# Patient Record
Sex: Female | Born: 1960 | Race: White | Hispanic: No | Marital: Single | Smoking: Never smoker
Health system: Southern US, Community
[De-identification: ages and names within clinical notes are randomized; demographics above are authoritative.]

## PROBLEM LIST (undated history)

## (undated) DIAGNOSIS — R002 Palpitations: Secondary | ICD-10-CM

## (undated) DIAGNOSIS — R030 Elevated blood-pressure reading, without diagnosis of hypertension: Secondary | ICD-10-CM

## (undated) DIAGNOSIS — R9439 Abnormal result of other cardiovascular function study: Secondary | ICD-10-CM

## (undated) DIAGNOSIS — Z8249 Family history of ischemic heart disease and other diseases of the circulatory system: Secondary | ICD-10-CM

## (undated) HISTORY — DX: Palpitations: R00.2

## (undated) HISTORY — PX: BREAST LUMPECTOMY: SHX2

## (undated) HISTORY — DX: Abnormal result of other cardiovascular function study: R94.39

## (undated) HISTORY — DX: Elevated blood-pressure reading, without diagnosis of hypertension: R03.0

## (undated) HISTORY — DX: Family history of ischemic heart disease and other diseases of the circulatory system: Z82.49

## (undated) HISTORY — PX: REDUCTION MAMMAPLASTY: SUR839

---

## 1999-07-09 ENCOUNTER — Other Ambulatory Visit: Admission: RE | Admit: 1999-07-09 | Discharge: 1999-07-09 | Payer: Self-pay | Admitting: Gynecology

## 1999-10-14 ENCOUNTER — Encounter: Admission: RE | Admit: 1999-10-14 | Discharge: 2000-01-12 | Payer: Self-pay | Admitting: Gynecology

## 2000-11-14 ENCOUNTER — Encounter: Payer: Self-pay | Admitting: Family Medicine

## 2000-11-14 ENCOUNTER — Encounter: Admission: RE | Admit: 2000-11-14 | Discharge: 2000-11-14 | Payer: Self-pay | Admitting: Family Medicine

## 2003-12-20 ENCOUNTER — Encounter: Admission: RE | Admit: 2003-12-20 | Discharge: 2003-12-20 | Payer: Self-pay | Admitting: Family Medicine

## 2004-12-23 ENCOUNTER — Encounter: Admission: RE | Admit: 2004-12-23 | Discharge: 2004-12-23 | Payer: Self-pay | Admitting: Family Medicine

## 2005-03-16 ENCOUNTER — Encounter: Admission: RE | Admit: 2005-03-16 | Discharge: 2005-03-16 | Payer: Self-pay | Admitting: Family Medicine

## 2006-04-08 ENCOUNTER — Encounter: Admission: RE | Admit: 2006-04-08 | Discharge: 2006-04-08 | Payer: Self-pay | Admitting: Family Medicine

## 2006-05-02 ENCOUNTER — Encounter: Admission: RE | Admit: 2006-05-02 | Discharge: 2006-05-02 | Payer: Self-pay | Admitting: Family Medicine

## 2010-04-26 ENCOUNTER — Encounter: Payer: Self-pay | Admitting: Family Medicine

## 2010-10-22 ENCOUNTER — Other Ambulatory Visit: Payer: Self-pay | Admitting: Family Medicine

## 2010-10-22 ENCOUNTER — Ambulatory Visit
Admission: RE | Admit: 2010-10-22 | Discharge: 2010-10-22 | Disposition: A | Discharge status: Home / Self Care | Payer: BC Managed Care – PPO | Source: Ambulatory Visit | Attending: Family Medicine | Admitting: Family Medicine

## 2010-10-22 DIAGNOSIS — M542 Cervicalgia: Secondary | ICD-10-CM

## 2010-10-27 ENCOUNTER — Other Ambulatory Visit: Payer: Self-pay | Admitting: Family Medicine

## 2010-10-27 ENCOUNTER — Ambulatory Visit
Admission: RE | Admit: 2010-10-27 | Discharge: 2010-10-27 | Disposition: A | Discharge status: Home / Self Care | Payer: BC Managed Care – PPO | Source: Ambulatory Visit | Attending: Family Medicine | Admitting: Family Medicine

## 2010-10-27 DIAGNOSIS — M542 Cervicalgia: Secondary | ICD-10-CM

## 2012-06-03 DIAGNOSIS — R002 Palpitations: Secondary | ICD-10-CM | POA: Insufficient documentation

## 2012-06-03 DIAGNOSIS — R9439 Abnormal result of other cardiovascular function study: Secondary | ICD-10-CM

## 2012-06-03 HISTORY — DX: Abnormal result of other cardiovascular function study: R94.39

## 2012-06-03 HISTORY — DX: Palpitations: R00.2

## 2012-07-10 HISTORY — PX: OTHER SURGICAL HISTORY: SHX169

## 2012-07-26 ENCOUNTER — Other Ambulatory Visit (HOSPITAL_COMMUNITY): Payer: Self-pay | Admitting: Cardiology

## 2012-07-26 DIAGNOSIS — R9439 Abnormal result of other cardiovascular function study: Secondary | ICD-10-CM

## 2012-08-04 ENCOUNTER — Ambulatory Visit (HOSPITAL_COMMUNITY)
Admission: RE | Admit: 2012-08-04 | Discharge: 2012-08-04 | Disposition: A | Discharge status: Home / Self Care | Payer: BC Managed Care – PPO | Source: Ambulatory Visit | Attending: Cardiovascular Disease | Admitting: Cardiovascular Disease

## 2012-08-04 DIAGNOSIS — R9439 Abnormal result of other cardiovascular function study: Secondary | ICD-10-CM | POA: Insufficient documentation

## 2012-08-04 HISTORY — PX: TRANSTHORACIC ECHOCARDIOGRAM: SHX275

## 2012-08-04 NOTE — Progress Notes (Signed)
 [facility]   2D echo completed 08/04/2012.   Veda Canning, RDCS

## 2012-08-16 ENCOUNTER — Telehealth: Payer: Self-pay | Admitting: Cardiology

## 2012-08-16 NOTE — Telephone Encounter (Signed)
called pt to r/s from no show on 4/22

## 2012-08-18 ENCOUNTER — Telehealth: Payer: Self-pay | Admitting: Cardiology

## 2012-08-18 NOTE — Telephone Encounter (Signed)
Wants echo results from 08-04-12 please!

## 2012-08-18 NOTE — Telephone Encounter (Signed)
Message forwarded to S. Martin, RN.  

## 2012-08-22 ENCOUNTER — Telehealth: Payer: Self-pay | Admitting: *Deleted

## 2012-08-22 NOTE — Telephone Encounter (Signed)
Spoke to patient  The results of Echo was given. Her follow up is in 7 months after CPET test. She should be on metoprolol 25 mg. She states that she is still having the same issue. She wants to know what is next.

## 2012-08-22 NOTE — Telephone Encounter (Signed)
I cannot answer that question without her chart. Please pull her paper chart.  Marykay Lex, MD

## 2012-08-23 NOTE — Telephone Encounter (Signed)
If she is still having Chest pains - with the moderately abnormal CPET & normal Echo -- we should proceed with TM Cardiolite Marykay Lex, MD

## 2012-08-30 NOTE — Telephone Encounter (Signed)
Call patient had to leave a message to call back

## 2012-08-31 ENCOUNTER — Telehealth: Payer: Self-pay | Admitting: *Deleted

## 2012-08-31 NOTE — Telephone Encounter (Signed)
That sounds reasonable.  When am I seeing her back? None of her notes have been uploaded to epic.

## 2012-08-31 NOTE — Telephone Encounter (Signed)
Spoke with patient. Per Dr Herbie Baltimore if symptoms are still persistent the next step is a stress myoview. Jessica Salinas states that the symptoms seem to getting better. She just recently increase her metoprolol up to 25 mg daily. She would like to monitor it a little longer. Inform her to do that and call if symptoms get worse. She is aware another MET test is plan for Oct 2014.

## 2012-12-20 ENCOUNTER — Other Ambulatory Visit: Payer: Self-pay | Admitting: *Deleted

## 2012-12-20 DIAGNOSIS — R0602 Shortness of breath: Secondary | ICD-10-CM

## 2013-02-13 ENCOUNTER — Telehealth: Payer: Self-pay | Admitting: Cardiology

## 2013-02-13 NOTE — Telephone Encounter (Signed)
Patient has been called several times to schedule Met test--CPET only that was ordered by Dr. Herbie Baltimore for October, 2014.  Calls were made 01/01/13--01/04/13--01/10/13.  Patient states that she cannot afford to have this test done.

## 2013-02-15 NOTE — Telephone Encounter (Signed)
We will just have to continue expectant management.   I am fine with cancelling CPET-MET.  Marykay Lex, MD

## 2013-02-15 NOTE — Telephone Encounter (Signed)
Will defer Dr Herbie Baltimore

## 2013-02-16 NOTE — Telephone Encounter (Signed)
Forward to [employer] on 02/15/13

## 2013-03-22 ENCOUNTER — Encounter: Payer: Self-pay | Admitting: Cardiology

## 2013-03-22 ENCOUNTER — Ambulatory Visit (INDEPENDENT_AMBULATORY_CARE_PROVIDER_SITE_OTHER): Payer: BC Managed Care – PPO | Admitting: Cardiology

## 2013-03-22 VITALS — BP 126/88 | HR 73 | Ht 67.0 in | Wt 148.9 lb

## 2013-03-22 DIAGNOSIS — R002 Palpitations: Secondary | ICD-10-CM

## 2013-03-22 DIAGNOSIS — Z79899 Other long term (current) drug therapy: Secondary | ICD-10-CM

## 2013-03-22 DIAGNOSIS — R9439 Abnormal result of other cardiovascular function study: Secondary | ICD-10-CM

## 2013-03-22 DIAGNOSIS — E782 Mixed hyperlipidemia: Secondary | ICD-10-CM

## 2013-03-22 DIAGNOSIS — Z8249 Family history of ischemic heart disease and other diseases of the circulatory system: Secondary | ICD-10-CM

## 2013-03-22 NOTE — Progress Notes (Signed)
PATIENT: Jessica Salinas MRN: [id number]  DOB: 10-Apr-1960   DOV:03/24/2013 PCP: Warrick Parisian, MD  Clinic Note: Chief Complaint  Patient presents with  . ROV 7 months    C/o occas mild shortness of breath-with exertion, rt leg pain-behind knee and calf, and lightheadedness.    HPI: Jessica Salinas is a 52 y.o.  female with a PMH below who presents today for delayed six-month followup for palpitations and abnormal CPET-MAC test with a Peak VO2 of 73%.  Interval History: I last saw her in April the recommendation at that time were increasing beta blocker dose for palpitations and heart rate response in CPET test. She is also to adjust her diet along with exercise for lipid control. She has had an echocardiogram that is relatively normal. She is supposed to do a repeat CPET, but did not schedule it due to timing and financial restraints.  She comes in today really feeling okay. She still does get exertional dyspnea if she really his exercise level. Only mildly short of breath, and less often. She has been trying to pick up her activity level,  But is in need pain of late. She does occasional dizziness and lightheadedness but is almost a vertigo type sensation of the world feeling unstable.  Most palpitations she notes occurring now or during hot flashes. She denies any exertional chest discomfort, and less palpitations and 4. No resting chest discomfort or dyspnea. No PND, orthopnea or edema. No significant near CAD, TIA or amaurosis fugax symptoms. No melena, hematochezia, hematuria or nosebleeds. No claudication.  Past Medical History  Diagnosis Date  . Family history of premature coronary artery disease     Father and brother with extensive CAD in early 21s. Also paternal grandfather died of MI at about 35.  . Borderline hypertension   . Palpitations March 2014    Event monitor with one episode of sinus tachycardia, rare PVCs. No review  . Abnormal cardiovascular stress test March 2014   Borderline maximal effort. 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 Transthoracic echocardiogram  08/04/2012    Normal LV size and function. Normal EF 55-60%. No wall motion abnormalities. Normal diastolic pressure.    Allergies  Allergen Reactions  . Sulfa Antibiotics Hives    Current Outpatient Prescriptions  Medication Sig Dispense Refill  . aspirin EC 81 MG tablet Take 81 mg by mouth daily.      Marland Kitchen escitalopram (LEXAPRO) 10 MG tablet Take 10 mg by mouth daily.      . metoprolol succinate (TOPROL-XL) 25 MG 24 hr tablet Take 25 mg by mouth daily.       No current facility-administered medications for this visit.    History   Social History Narrative   She is a married, mother of 2. Does not smoke and does not drink.   ROS: A comprehensive Review of Systems - Negative except pertinent symptoms noted above including knee and calf pain on the right. Otherwise negative. She is starting to have hot flashes.  PHYSICAL EXAM BP 126/88  Pulse 73  Ht 5\' 7"  (1.702 m)  Wt 148 lb 14.4 oz (67.541 kg)  BMI 23.32 kg/m2 General appearance: Alert and oriented x3. Pleasant mood and affect. Healthy-appearing, appears stated age. Well-nourished well-groomed. HEENT: [place]/AT, EOMI, MMM, anicteric sclera Neck: no adenopathy, no carotid bruit, no JVD, supple, symmetrical, trachea  midline and thyroid not enlarged, symmetric, no tenderness/mass/nodules Lungs: clear to auscultation bilaterally, normal percussion bilaterally and Nonlabored, good air movement Heart: regular rate and rhythm, S1, S2 normal, no murmur, click, rub or gallop and normal apical impulse Abdomen: soft, non-tender; bowel sounds normal; no masses,  no organomegaly Extremities: extremities normal, atraumatic, no cyanosis or edema Pulses: 2+ and  symmetric Neurologic: Grossly normal  WUJ:[id number] today: Yes Rate: 73 , Rhythm: NSR, normal ECG;   Recent Labs: No recent labs  ASSESSMENT / PLAN: Abnormal cardiovascular stress test The plan was for her to have repeat study to investigate how well she is done with diet and exercise modification. I did spend several minutes explained to her the importance of this result. 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 With this result of  Peak VO2 of 73%,she had reversible phase of CAD.  Plan: She is still reluctant to use medications besides metoprolol. Continue metoprolol 25 mg daily. Also recommended potentially using coenzyme Q10. Otherwise the recommendation is to continue to increase her exercise level and loss titrate intake to reduce lipid profile.  Recheck lipid profile in the summertime. Then followup after  If available at that time, will order repeat cardiovascular stress test to determine if her status has improved.  Family history of premature coronary artery disease If her lipid panel was not improved at next followup, we would need to consider treatment with a statin. We'll continue beta blocker and aspirin for prevention.  Mixed hyperlipidemia Followup lipids in the summertime. Would need to use statin if not improved with dietary modification plus exercise.  Palpitations Improved on beta blocker. Only noted now with hot flashes.    No orders of the defined types were placed in this encounter.   Meds ordered this encounter  Medications  . escitalopram (LEXAPRO) 10 MG tablet    Sig: Take 10 mg by mouth daily.  . metoprolol succinate (TOPROL-XL) 25 MG 24 hr tablet    Sig: Take 25 mg by mouth daily.  Marland Kitchen aspirin EC 81 MG tablet    Sig: Take 81 mg by mouth daily.    Followup: Roughly 9-10 months  DAVID W. Herbie Baltimore, M.D., M.S. [facility] [address] [place], [place]  [address]  [phone] Pager # [phone]

## 2013-03-22 NOTE — Patient Instructions (Addendum)
Please do labs -cmp ,lipid -fasting(nothing to eat or drink the morning of labs) Aug 2015. Will mail you the lab in July 2015   Your physician wants you to follow-up in Sept 2015  You will receive a reminder letter in the mail two months in advance. If you don't receive a letter, please call our office to schedule the follow-up appointment.

## 2013-03-24 ENCOUNTER — Encounter: Payer: Self-pay | Admitting: Cardiology

## 2013-03-24 DIAGNOSIS — Z8249 Family history of ischemic heart disease and other diseases of the circulatory system: Secondary | ICD-10-CM | POA: Insufficient documentation

## 2013-03-24 DIAGNOSIS — E782 Mixed hyperlipidemia: Secondary | ICD-10-CM | POA: Insufficient documentation

## 2013-03-24 NOTE — Assessment & Plan Note (Signed)
Followup lipids in the summertime. Would need to use statin if not improved with dietary modification plus exercise.

## 2013-03-24 NOTE — Assessment & Plan Note (Signed)
If her lipid panel was not improved at next followup, we would need to consider treatment with a statin. We'll continue beta blocker and aspirin for prevention.

## 2013-03-24 NOTE — Assessment & Plan Note (Signed)
The plan was for her to have repeat study to investigate how well she is done with diet and exercise modification. I did spend several minutes explained to her the importance of this result. 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 With this result of  Peak VO2 of 73%,she had reversible phase of CAD.  Plan: She is still reluctant to use medications besides metoprolol. Continue metoprolol 25 mg daily. Also recommended potentially using coenzyme Q10. Otherwise the recommendation is to continue to increase her exercise level and loss titrate intake to reduce lipid profile.  Recheck lipid profile in the summertime. Then followup after  If available at that time, will order repeat cardiovascular stress test to determine if her status has improved.

## 2013-03-24 NOTE — Assessment & Plan Note (Signed)
Improved on beta blocker. Only noted now with hot flashes.

## 2013-05-10 ENCOUNTER — Encounter: Payer: Self-pay | Admitting: *Deleted

## 2013-07-25 NOTE — Telephone Encounter (Signed)
This encounter has been closed 

## 2013-07-27 ENCOUNTER — Other Ambulatory Visit: Payer: Self-pay

## 2013-07-27 MED ORDER — METOPROLOL SUCCINATE ER 25 MG PO TB24: 25.0000 mg | ORAL_TABLET | Freq: Every day | ORAL | Status: AC

## 2013-10-08 ENCOUNTER — Telehealth: Payer: Self-pay | Admitting: *Deleted

## 2013-10-08 DIAGNOSIS — Z79899 Other long term (current) drug therapy: Secondary | ICD-10-CM

## 2013-10-08 DIAGNOSIS — E782 Mixed hyperlipidemia: Secondary | ICD-10-CM

## 2013-10-08 NOTE — Telephone Encounter (Signed)
MAIL LETTER AND LAB SLIP-lipid ,cmp

## 2013-10-08 NOTE — Telephone Encounter (Signed)
Message copied by Tobin ChadMARTIN, SHARON V. on Mon Oct 08, 2013  9:06 AM ------      Message from: Tobin ChadMARTIN, SHARON V.      Created: Thu Mar 22, 2013  9:18 AM       cmp ,lipid labslip 10/2013-mail            Release from visit 03/22/13 ------

## 2016-11-24 ENCOUNTER — Telehealth: Payer: Self-pay | Admitting: *Deleted

## 2016-11-24 NOTE — Telephone Encounter (Signed)
NOTES SENT TO SCHEDULING.  °

## 2016-11-25 ENCOUNTER — Telehealth: Payer: Self-pay | Admitting: Cardiology

## 2016-11-25 NOTE — Telephone Encounter (Signed)
Received records from [facility] [place] for appointment on 12/23/16 with Dr Herbie Baltimore.  Records put with Dr Elissa Hefty schedule for 12/23/16. lp

## 2016-12-23 ENCOUNTER — Ambulatory Visit: Payer: BC Managed Care – PPO | Admitting: Cardiology

## 2019-05-02 ENCOUNTER — Other Ambulatory Visit: Payer: Self-pay | Admitting: Family Medicine

## 2019-05-02 DIAGNOSIS — Z1231 Encounter for screening mammogram for malignant neoplasm of breast: Secondary | ICD-10-CM

## 2019-05-17 ENCOUNTER — Ambulatory Visit: Payer: BC Managed Care – PPO | Attending: Internal Medicine

## 2019-05-17 DIAGNOSIS — Z23 Encounter for immunization: Secondary | ICD-10-CM | POA: Insufficient documentation

## 2019-05-17 NOTE — Progress Notes (Signed)
   Covid-19 Vaccination Clinic  Name:  Jessica Salinas    MRN: [id number] DOB: 1960/07/29  05/17/2019  [name] was observed post Covid-19 immunization for 15 minutes without incidence. She was provided with Vaccine Information Sheet and instruction to access the V-Safe system.   [name] was instructed to call 911 with any severe reactions post vaccine: Marland Kitchen Difficulty breathing  . Swelling of your face and throat  . A fast heartbeat  . A bad rash all over your body  . Dizziness and weakness    Immunizations Administered    Name Date Dose VIS Date Route   Pfizer COVID-19 Vaccine 05/17/2019  3:44 PM 0.3 mL 03/16/2019 Intramuscular   Manufacturer: [employer], [employer]   Lot: [id number]   NDC: [address]-1

## 2019-06-09 ENCOUNTER — Ambulatory Visit: Payer: BC Managed Care – PPO | Attending: Internal Medicine

## 2019-06-09 DIAGNOSIS — Z23 Encounter for immunization: Secondary | ICD-10-CM | POA: Insufficient documentation

## 2019-06-09 NOTE — Progress Notes (Signed)
   Covid-19 Vaccination Clinic  Name:  Jessica Salinas    MRN: [id number] DOB: 02-Apr-1961  06/09/2019  [name] was observed post Covid-19 immunization for 15 minutes without incident. She was provided with Vaccine Information Sheet and instruction to access the V-Safe system.   [name] was instructed to call 911 with any severe reactions post vaccine: Marland Kitchen Difficulty breathing  . Swelling of face and throat  . A fast heartbeat  . A bad rash all over body  . Dizziness and weakness   Immunizations Administered    Name Date Dose VIS Date Route   Pfizer COVID-19 Vaccine 06/09/2019  1:38 PM 0.3 mL 03/16/2019 Intramuscular   Manufacturer: [employer], [employer]   Lot: [id number]   NDC: [address]-0

## 2019-06-12 ENCOUNTER — Ambulatory Visit: Payer: BC Managed Care – PPO

## 2019-07-23 ENCOUNTER — Ambulatory Visit
Admission: RE | Admit: 2019-07-23 | Discharge: 2019-07-23 | Disposition: A | Discharge status: Home / Self Care | Payer: BC Managed Care – PPO | Source: Ambulatory Visit | Attending: Family Medicine | Admitting: Family Medicine

## 2019-07-23 ENCOUNTER — Other Ambulatory Visit: Payer: Self-pay

## 2019-07-23 DIAGNOSIS — Z1231 Encounter for screening mammogram for malignant neoplasm of breast: Secondary | ICD-10-CM

## 2019-07-30 ENCOUNTER — Other Ambulatory Visit (HOSPITAL_COMMUNITY)
Admission: RE | Admit: 2019-07-30 | Discharge: 2019-07-30 | Disposition: A | Discharge status: Home / Self Care | Payer: BC Managed Care – PPO | Source: Ambulatory Visit | Attending: Family Medicine | Admitting: Family Medicine

## 2019-07-30 ENCOUNTER — Other Ambulatory Visit: Payer: Self-pay | Admitting: Family Medicine

## 2019-07-30 DIAGNOSIS — Z124 Encounter for screening for malignant neoplasm of cervix: Secondary | ICD-10-CM | POA: Diagnosis present

## 2019-08-01 LAB — CYTOLOGY - PAP
Comment: NEGATIVE
Diagnosis: NEGATIVE
High risk HPV: NEGATIVE

## 2020-09-02 ENCOUNTER — Other Ambulatory Visit: Payer: Self-pay | Admitting: Family Medicine

## 2020-09-02 DIAGNOSIS — Z1231 Encounter for screening mammogram for malignant neoplasm of breast: Secondary | ICD-10-CM

## 2020-09-04 ENCOUNTER — Other Ambulatory Visit: Payer: Self-pay

## 2020-09-04 ENCOUNTER — Ambulatory Visit
Admission: RE | Admit: 2020-09-04 | Discharge: 2020-09-04 | Disposition: A | Discharge status: Home / Self Care | Payer: BC Managed Care – PPO | Source: Ambulatory Visit | Attending: Family Medicine | Admitting: Family Medicine

## 2020-09-04 DIAGNOSIS — Z1231 Encounter for screening mammogram for malignant neoplasm of breast: Secondary | ICD-10-CM

## 2021-08-29 IMAGING — MG MM DIGITAL SCREENING BILAT W/ TOMO AND CAD
6 of 10 series · 6 of 30 positions shown · non-contrast
Comparison: Previous exam(s).

CLINICAL DATA: Screening.

EXAM:
DIGITAL SCREENING BILATERAL MAMMOGRAM WITH TOMOSYNTHESIS AND CAD
TECHNIQUE: Bilateral screening digital craniocaudal and mediolateral oblique
mammograms were obtained. Bilateral screening digital breast
tomosynthesis was performed. The images were evaluated with
computer-aided detection.

[L CC synth-2D (1 of 2)]
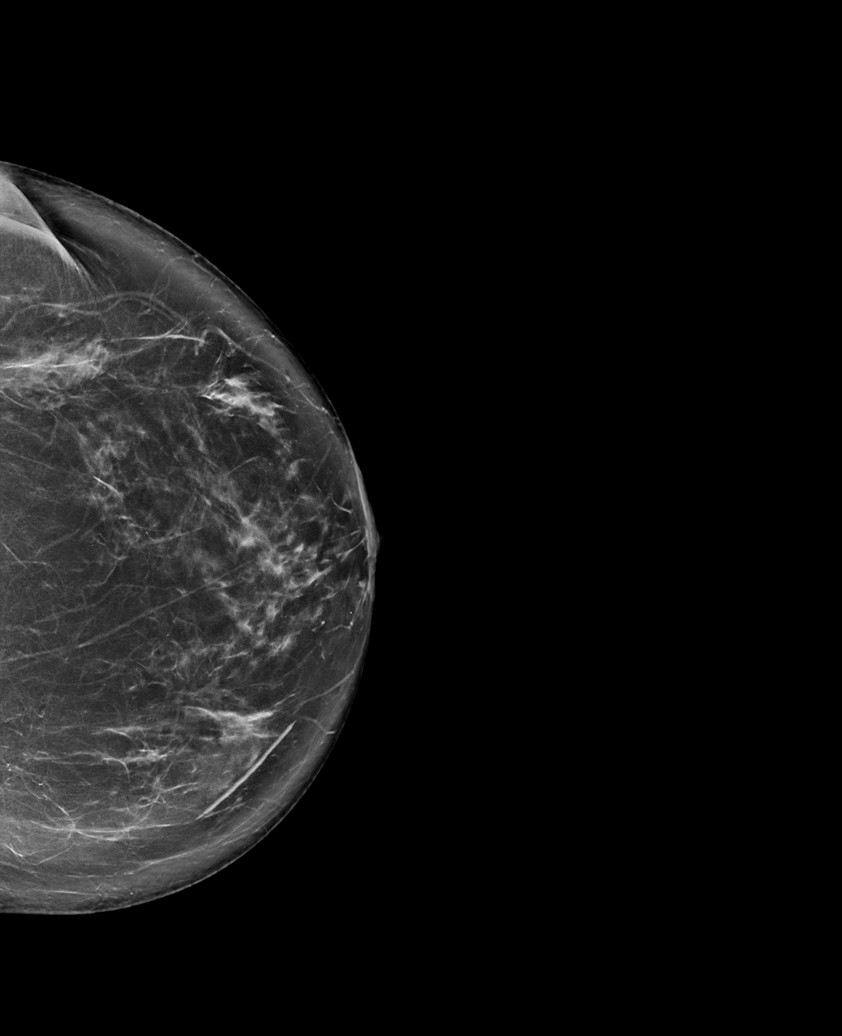

[L MLO synth-2D]
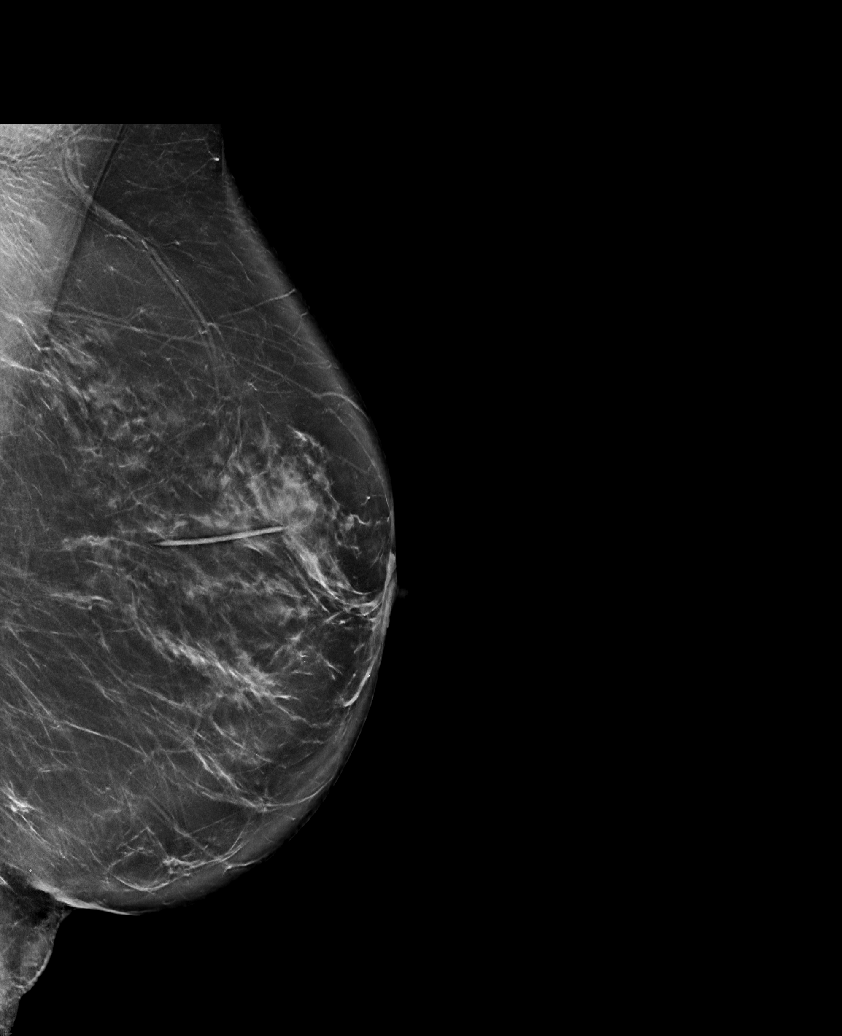

[R CC synth-2D]
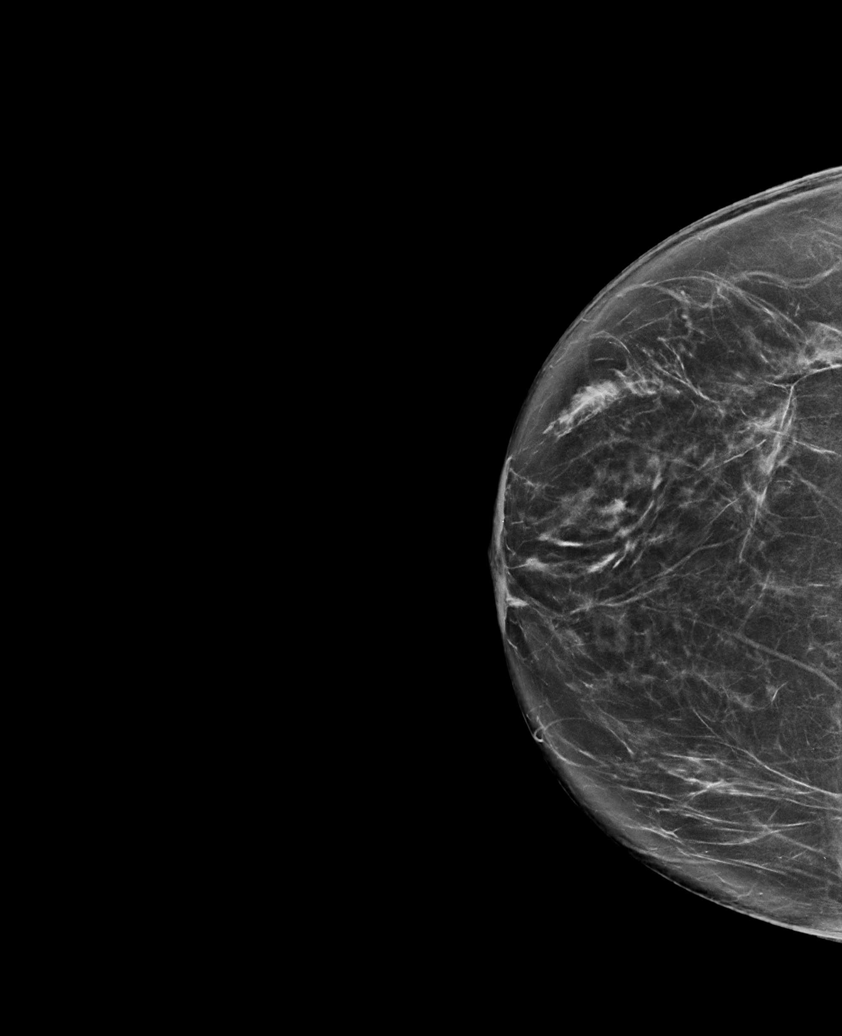

[R MLO synth-2D]
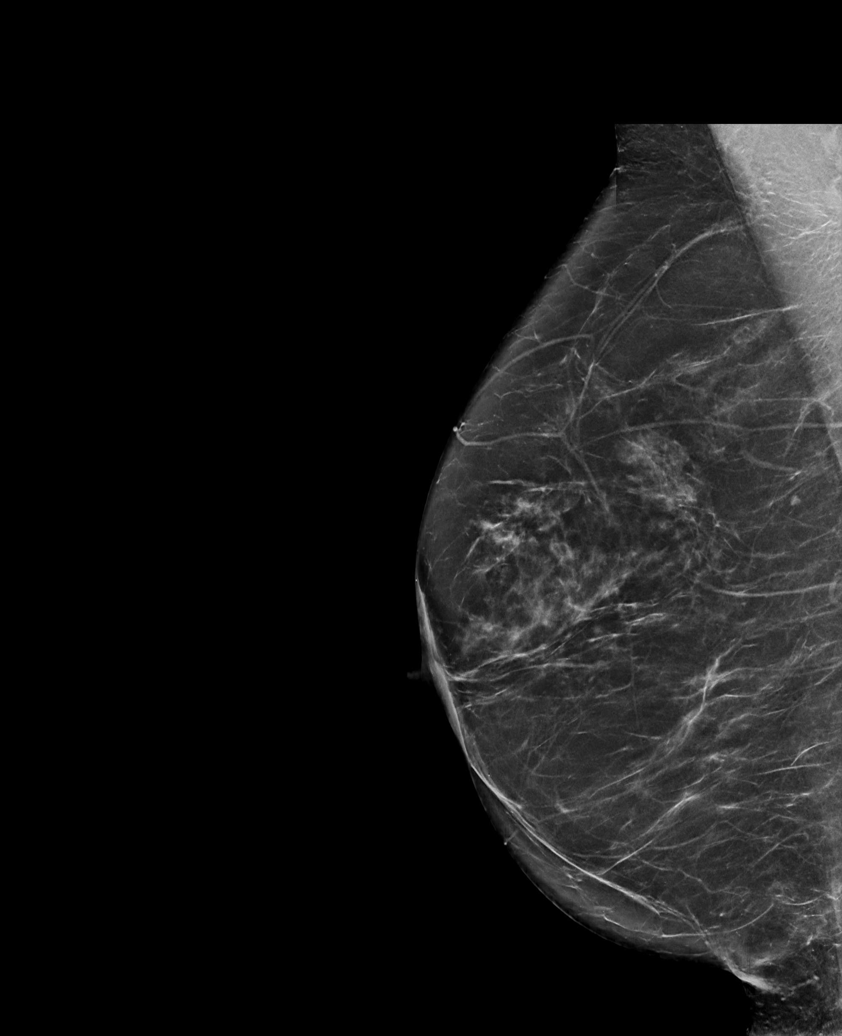

[L CC synth-2D (2 of 2)]
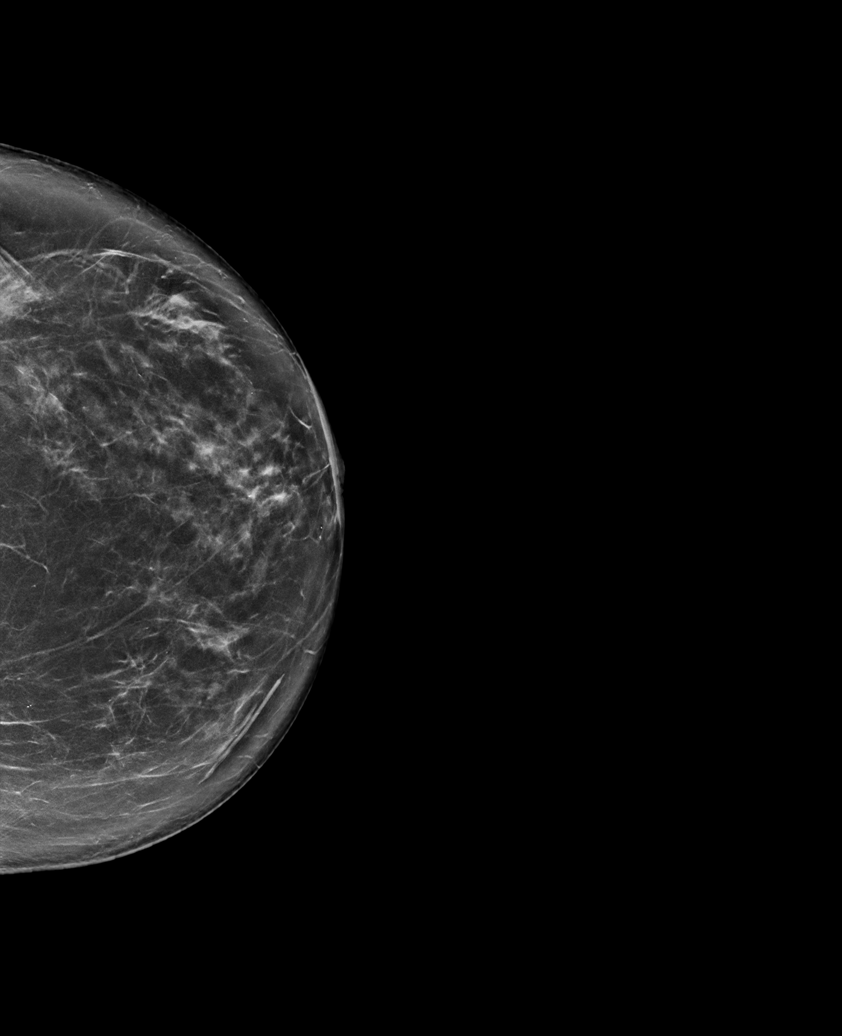

[L CC tomo · tomo slice 43/85.0]
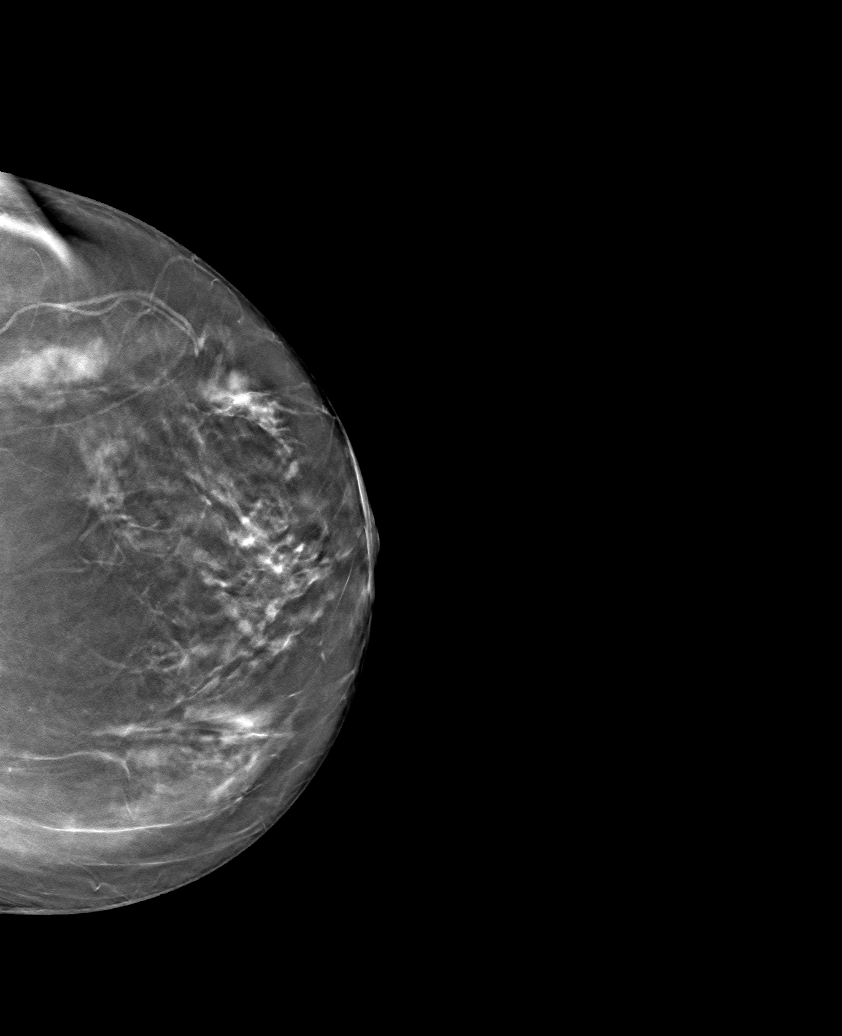

[6 of 30 positions shown; findings below may reference images not displayed]

ACR Breast Density Category b: There are scattered areas of
fibroglandular density.
FINDINGS: There are no findings suspicious for malignancy. The images were
evaluated with computer-aided detection.
IMPRESSION: No mammographic evidence of malignancy. A result letter of this
screening mammogram will be mailed directly to the patient.

RECOMMENDATION:
Screening mammogram in one year. (Code:[id number])

BI-RADS CATEGORY  1: Negative.

## 2022-08-05 ENCOUNTER — Other Ambulatory Visit: Payer: Self-pay | Admitting: Family Medicine

## 2022-08-05 DIAGNOSIS — Z0000 Encounter for general adult medical examination without abnormal findings: Secondary | ICD-10-CM

## 2022-09-08 ENCOUNTER — Ambulatory Visit
Admission: RE | Admit: 2022-09-08 | Discharge: 2022-09-08 | Disposition: A | Discharge status: Home / Self Care | Payer: BC Managed Care – PPO | Source: Ambulatory Visit | Attending: Family Medicine | Admitting: Family Medicine

## 2022-09-08 DIAGNOSIS — Z0000 Encounter for general adult medical examination without abnormal findings: Secondary | ICD-10-CM

## 2023-01-11 ENCOUNTER — Other Ambulatory Visit (HOSPITAL_BASED_OUTPATIENT_CLINIC_OR_DEPARTMENT_OTHER): Payer: Self-pay | Admitting: Family Medicine

## 2023-01-11 DIAGNOSIS — E7800 Pure hypercholesterolemia, unspecified: Secondary | ICD-10-CM

## 2023-01-11 DIAGNOSIS — E785 Hyperlipidemia, unspecified: Secondary | ICD-10-CM

## 2023-02-10 ENCOUNTER — Ambulatory Visit (HOSPITAL_BASED_OUTPATIENT_CLINIC_OR_DEPARTMENT_OTHER)
Admission: RE | Admit: 2023-02-10 | Discharge: 2023-02-10 | Disposition: A | Discharge status: Home / Self Care | Payer: BC Managed Care – PPO | Source: Ambulatory Visit | Attending: Family Medicine | Admitting: Family Medicine

## 2023-02-10 DIAGNOSIS — E7800 Pure hypercholesterolemia, unspecified: Secondary | ICD-10-CM | POA: Insufficient documentation

## 2023-02-10 DIAGNOSIS — E785 Hyperlipidemia, unspecified: Secondary | ICD-10-CM | POA: Insufficient documentation

## 2023-11-07 ENCOUNTER — Other Ambulatory Visit: Payer: Self-pay | Admitting: Family Medicine

## 2023-11-07 DIAGNOSIS — Z1231 Encounter for screening mammogram for malignant neoplasm of breast: Secondary | ICD-10-CM

## 2023-11-28 ENCOUNTER — Ambulatory Visit
Admission: RE | Admit: 2023-11-28 | Discharge: 2023-11-28 | Disposition: A | Discharge status: Home / Self Care | Payer: Self-pay | Source: Ambulatory Visit | Attending: Family Medicine | Admitting: Family Medicine

## 2023-11-28 DIAGNOSIS — Z1231 Encounter for screening mammogram for malignant neoplasm of breast: Secondary | ICD-10-CM

## 2024-01-05 ENCOUNTER — Other Ambulatory Visit (HOSPITAL_BASED_OUTPATIENT_CLINIC_OR_DEPARTMENT_OTHER): Payer: Self-pay | Admitting: Family Medicine

## 2024-01-05 DIAGNOSIS — Z780 Asymptomatic menopausal state: Secondary | ICD-10-CM
